# Patient Record
Sex: Male | Born: 1960 | Race: White | Hispanic: No | Marital: Married | Smoking: Current every day smoker
Health system: Southern US, Community
[De-identification: ages and names within clinical notes are randomized; demographics above are authoritative.]

## PROBLEM LIST (undated history)

## (undated) ENCOUNTER — Inpatient Hospital Stay: Admission: EM | Payer: Self-pay | Source: Home / Self Care

---

## 2001-04-17 ENCOUNTER — Emergency Department (HOSPITAL_COMMUNITY): Admission: EM | Admit: 2001-04-17 | Discharge: 2001-04-17 | Payer: Self-pay | Admitting: Emergency Medicine

## 2001-04-17 ENCOUNTER — Encounter: Payer: Self-pay | Admitting: Emergency Medicine

## 2001-05-12 ENCOUNTER — Encounter: Payer: Self-pay | Admitting: Specialist

## 2001-05-12 ENCOUNTER — Ambulatory Visit (HOSPITAL_COMMUNITY): Admission: RE | Admit: 2001-05-12 | Discharge: 2001-05-12 | Payer: Self-pay | Admitting: Specialist

## 2002-06-30 ENCOUNTER — Emergency Department (HOSPITAL_COMMUNITY): Admission: EM | Admit: 2002-06-30 | Discharge: 2002-06-30 | Payer: Self-pay | Admitting: Emergency Medicine

## 2002-06-30 ENCOUNTER — Encounter: Payer: Self-pay | Admitting: Internal Medicine

## 2004-02-02 ENCOUNTER — Emergency Department (HOSPITAL_COMMUNITY): Admission: EM | Admit: 2004-02-02 | Discharge: 2004-02-02 | Payer: Self-pay

## 2005-09-17 ENCOUNTER — Emergency Department (HOSPITAL_COMMUNITY): Admission: EM | Admit: 2005-09-17 | Discharge: 2005-09-17 | Payer: Self-pay | Admitting: Emergency Medicine

## 2006-07-03 ENCOUNTER — Emergency Department (HOSPITAL_COMMUNITY): Admission: EM | Admit: 2006-07-03 | Discharge: 2006-07-03 | Payer: Self-pay | Admitting: Emergency Medicine

## 2006-10-17 ENCOUNTER — Emergency Department (HOSPITAL_COMMUNITY): Admission: EM | Admit: 2006-10-17 | Discharge: 2006-10-17 | Payer: Self-pay | Admitting: Emergency Medicine

## 2007-02-07 ENCOUNTER — Emergency Department (HOSPITAL_COMMUNITY): Admission: EM | Admit: 2007-02-07 | Discharge: 2007-02-07 | Payer: Self-pay | Admitting: Emergency Medicine

## 2008-10-22 ENCOUNTER — Emergency Department (HOSPITAL_COMMUNITY): Admission: EM | Admit: 2008-10-22 | Discharge: 2008-10-22 | Payer: Self-pay | Admitting: Emergency Medicine

## 2010-07-03 ENCOUNTER — Emergency Department (HOSPITAL_COMMUNITY)
Admission: EM | Admit: 2010-07-03 | Discharge: 2010-07-03 | Disposition: A | Discharge status: Home / Self Care | Payer: Self-pay | Attending: Emergency Medicine | Admitting: Emergency Medicine

## 2010-07-03 DIAGNOSIS — L02419 Cutaneous abscess of limb, unspecified: Secondary | ICD-10-CM | POA: Insufficient documentation

## 2012-06-02 ENCOUNTER — Emergency Department (HOSPITAL_COMMUNITY): Payer: No Typology Code available for payment source

## 2012-06-02 ENCOUNTER — Emergency Department (HOSPITAL_COMMUNITY)
Admission: EM | Admit: 2012-06-02 | Discharge: 2012-06-02 | Disposition: A | Discharge status: Home / Self Care | Payer: No Typology Code available for payment source | Attending: Emergency Medicine | Admitting: Emergency Medicine

## 2012-06-02 ENCOUNTER — Encounter (HOSPITAL_COMMUNITY): Payer: Self-pay | Admitting: *Deleted

## 2012-06-02 DIAGNOSIS — S5000XA Contusion of unspecified elbow, initial encounter: Secondary | ICD-10-CM | POA: Insufficient documentation

## 2012-06-02 DIAGNOSIS — S5001XA Contusion of right elbow, initial encounter: Secondary | ICD-10-CM

## 2012-06-02 DIAGNOSIS — S59909A Unspecified injury of unspecified elbow, initial encounter: Secondary | ICD-10-CM | POA: Insufficient documentation

## 2012-06-02 DIAGNOSIS — F17200 Nicotine dependence, unspecified, uncomplicated: Secondary | ICD-10-CM | POA: Insufficient documentation

## 2012-06-02 DIAGNOSIS — Y9389 Activity, other specified: Secondary | ICD-10-CM | POA: Insufficient documentation

## 2012-06-02 DIAGNOSIS — S6990XA Unspecified injury of unspecified wrist, hand and finger(s), initial encounter: Secondary | ICD-10-CM | POA: Insufficient documentation

## 2012-06-02 DIAGNOSIS — S20219A Contusion of unspecified front wall of thorax, initial encounter: Secondary | ICD-10-CM

## 2012-06-02 MED ORDER — CYCLOBENZAPRINE HCL 5 MG PO TABS: 5.0000 mg | ORAL_TABLET | Freq: Three times a day (TID) | ORAL | Status: AC | PRN

## 2012-06-02 MED ORDER — IBUPROFEN 400 MG PO TABS
400.0000 mg | ORAL_TABLET | Freq: Once | ORAL | Status: AC
  Administered 2012-06-02: 400 mg via ORAL
  Filled 2012-06-02: qty 1

## 2012-06-02 MED ORDER — TRAMADOL HCL 50 MG PO TABS
100.0000 mg | ORAL_TABLET | Freq: Once | ORAL | Status: AC
  Administered 2012-06-02: 100 mg via ORAL
  Filled 2012-06-02: qty 2

## 2012-06-02 MED ORDER — TRAMADOL HCL 50 MG PO TABS: 50.0000 mg | ORAL_TABLET | Freq: Four times a day (QID) | ORAL | Status: AC | PRN

## 2012-06-02 MED ORDER — IBUPROFEN 600 MG PO TABS: 600.0000 mg | ORAL_TABLET | Freq: Four times a day (QID) | ORAL | Status: AC | PRN

## 2012-06-02 MED ORDER — CYCLOBENZAPRINE HCL 10 MG PO TABS
10.0000 mg | ORAL_TABLET | Freq: Once | ORAL | Status: AC
  Administered 2012-06-02: 10 mg via ORAL
  Filled 2012-06-02: qty 1
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
## 2012-12-06 ENCOUNTER — Other Ambulatory Visit (HOSPITAL_COMMUNITY): Payer: Self-pay | Admitting: Urology

## 2012-12-06 DIAGNOSIS — N451 Epididymitis: Secondary | ICD-10-CM

## 2012-12-08 ENCOUNTER — Ambulatory Visit (HOSPITAL_COMMUNITY)
Admission: RE | Admit: 2012-12-08 | Discharge: 2012-12-08 | Disposition: A | Discharge status: Home / Self Care | Payer: BC Managed Care – PPO | Source: Ambulatory Visit | Attending: Urology | Admitting: Urology

## 2012-12-08 DIAGNOSIS — N509 Disorder of male genital organs, unspecified: Secondary | ICD-10-CM | POA: Insufficient documentation

## 2012-12-08 DIAGNOSIS — N433 Hydrocele, unspecified: Secondary | ICD-10-CM | POA: Insufficient documentation

## 2012-12-08 DIAGNOSIS — N508 Other specified disorders of male genital organs: Secondary | ICD-10-CM | POA: Insufficient documentation

## 2012-12-08 DIAGNOSIS — N451 Epididymitis: Secondary | ICD-10-CM

## 2013-05-08 ENCOUNTER — Other Ambulatory Visit (HOSPITAL_COMMUNITY): Payer: Self-pay | Admitting: Family Medicine

## 2013-05-08 DIAGNOSIS — M542 Cervicalgia: Secondary | ICD-10-CM

## 2013-05-16 ENCOUNTER — Ambulatory Visit (HOSPITAL_COMMUNITY)
Admission: RE | Admit: 2013-05-16 | Discharge: 2013-05-16 | Disposition: A | Discharge status: Home / Self Care | Payer: BC Managed Care – PPO | Source: Ambulatory Visit | Attending: Family Medicine | Admitting: Family Medicine

## 2013-05-16 ENCOUNTER — Other Ambulatory Visit (HOSPITAL_COMMUNITY): Payer: Self-pay | Admitting: Family Medicine

## 2013-05-16 DIAGNOSIS — M542 Cervicalgia: Secondary | ICD-10-CM

## 2013-05-16 DIAGNOSIS — M25569 Pain in unspecified knee: Secondary | ICD-10-CM | POA: Insufficient documentation

## 2013-05-16 DIAGNOSIS — M4802 Spinal stenosis, cervical region: Secondary | ICD-10-CM | POA: Insufficient documentation

## 2013-05-16 DIAGNOSIS — M1990 Unspecified osteoarthritis, unspecified site: Secondary | ICD-10-CM

## 2013-05-16 DIAGNOSIS — M79609 Pain in unspecified limb: Secondary | ICD-10-CM | POA: Insufficient documentation

## 2013-05-16 DIAGNOSIS — M47812 Spondylosis without myelopathy or radiculopathy, cervical region: Secondary | ICD-10-CM | POA: Insufficient documentation

## 2013-05-16 DIAGNOSIS — M5020 Other cervical disc displacement, unspecified cervical region: Secondary | ICD-10-CM | POA: Insufficient documentation

## 2015-09-26 ENCOUNTER — Other Ambulatory Visit (HOSPITAL_COMMUNITY): Payer: Self-pay | Admitting: Family Medicine

## 2015-09-26 ENCOUNTER — Ambulatory Visit (HOSPITAL_COMMUNITY)
Admission: RE | Admit: 2015-09-26 | Discharge: 2015-09-26 | Disposition: A | Discharge status: Home / Self Care | Payer: 59 | Source: Ambulatory Visit | Attending: Family Medicine | Admitting: Family Medicine

## 2015-09-26 DIAGNOSIS — R52 Pain, unspecified: Secondary | ICD-10-CM

## 2015-09-26 DIAGNOSIS — T148XXA Other injury of unspecified body region, initial encounter: Secondary | ICD-10-CM

## 2015-09-26 DIAGNOSIS — M1990 Unspecified osteoarthritis, unspecified site: Secondary | ICD-10-CM | POA: Diagnosis not present

## 2017-07-25 ENCOUNTER — Other Ambulatory Visit (HOSPITAL_COMMUNITY): Payer: Self-pay | Admitting: Family Medicine

## 2017-07-25 ENCOUNTER — Ambulatory Visit (HOSPITAL_COMMUNITY)
Admission: RE | Admit: 2017-07-25 | Discharge: 2017-07-25 | Disposition: A | Discharge status: Home / Self Care | Payer: 59 | Source: Ambulatory Visit | Attending: Family Medicine | Admitting: Family Medicine

## 2017-07-25 DIAGNOSIS — T1490XA Injury, unspecified, initial encounter: Secondary | ICD-10-CM

## 2017-07-25 DIAGNOSIS — X58XXXA Exposure to other specified factors, initial encounter: Secondary | ICD-10-CM | POA: Diagnosis not present

## 2017-11-10 ENCOUNTER — Other Ambulatory Visit (HOSPITAL_COMMUNITY): Payer: Self-pay | Admitting: Family Medicine

## 2017-11-10 DIAGNOSIS — M542 Cervicalgia: Secondary | ICD-10-CM

## 2017-11-16 ENCOUNTER — Ambulatory Visit (HOSPITAL_COMMUNITY): Payer: 59

## 2017-11-23 ENCOUNTER — Ambulatory Visit (HOSPITAL_COMMUNITY)
Admission: RE | Admit: 2017-11-23 | Discharge: 2017-11-23 | Disposition: A | Discharge status: Home / Self Care | Payer: 59 | Source: Ambulatory Visit | Attending: Family Medicine | Admitting: Family Medicine

## 2017-11-23 DIAGNOSIS — M4802 Spinal stenosis, cervical region: Secondary | ICD-10-CM | POA: Diagnosis not present

## 2017-11-23 DIAGNOSIS — M47812 Spondylosis without myelopathy or radiculopathy, cervical region: Secondary | ICD-10-CM | POA: Diagnosis not present

## 2017-11-23 DIAGNOSIS — M542 Cervicalgia: Secondary | ICD-10-CM | POA: Diagnosis not present

## 2017-11-23 DIAGNOSIS — M79602 Pain in left arm: Secondary | ICD-10-CM | POA: Insufficient documentation

## 2018-09-14 DIAGNOSIS — F17210 Nicotine dependence, cigarettes, uncomplicated: Secondary | ICD-10-CM | POA: Diagnosis not present

## 2018-09-14 DIAGNOSIS — L02213 Cutaneous abscess of chest wall: Secondary | ICD-10-CM | POA: Diagnosis present

## 2018-09-15 ENCOUNTER — Emergency Department (HOSPITAL_COMMUNITY)
Admission: EM | Admit: 2018-09-15 | Discharge: 2018-09-15 | Disposition: A | Discharge status: Home / Self Care | Payer: BLUE CROSS/BLUE SHIELD | Attending: Emergency Medicine | Admitting: Emergency Medicine

## 2018-09-15 ENCOUNTER — Other Ambulatory Visit: Payer: Self-pay

## 2018-09-15 ENCOUNTER — Encounter (HOSPITAL_COMMUNITY): Payer: Self-pay

## 2018-09-15 DIAGNOSIS — L02213 Cutaneous abscess of chest wall: Secondary | ICD-10-CM

## 2018-09-15 MED ORDER — DOXYCYCLINE HYCLATE 100 MG PO TABS
100.0000 mg | ORAL_TABLET | Freq: Once | ORAL | Status: AC
  Administered 2018-09-15: 03:00:00 100 mg via ORAL
  Filled 2018-09-15: qty 1

## 2018-09-15 MED ORDER — LIDOCAINE-EPINEPHRINE (PF) 2 %-1:200000 IJ SOLN
10.0000 mL | Freq: Once | INTRAMUSCULAR | Status: AC
  Administered 2018-09-15: 10 mL
  Filled 2018-09-15: qty 10

## 2018-09-15 MED ORDER — DOXYCYCLINE HYCLATE 100 MG PO CAPS: 100.0000 mg | ORAL_CAPSULE | Freq: Two times a day (BID) | ORAL | 0 refills | Status: AC

## 2018-09-15 MED ORDER — POVIDONE-IODINE 10 % EX SOLN
CUTANEOUS | Status: AC
  Administered 2018-09-15: 2
  Filled 2018-09-15: qty 30

## 2018-09-15 NOTE — ED Triage Notes (Signed)
Pt has an area under his left armpit that he thought was a tick, has tried to remove it, but not sure if the tick is still there, redness and abrasion present

## 2018-09-15 NOTE — ED Notes (Signed)
Dressing applied under patient's left arm. 

## 2018-09-15 NOTE — ED Provider Notes (Addendum)
[facility] Provider Note   CSN: [id number] Arrival date & time: 09/14/18  2359    History   Chief Complaint Chief Complaint  Patient presents with  . Abscess    HPI Evan Hendricks is a 58 y.o. male.  The history is provided by the patient.  He has a lesion in the left axilla which he thinks may be a tick bite.  It is itchy and states he generally does not feel well.  Has been gradually expanding appropriately 2 days since it first occurred.  He can see a dark area in the center and is concerned that a tick is buried in there.  He denies fever chills.  Denies arthralgias or myalgias.  History reviewed. No pertinent past medical history.  There are no active problems to display for this patient.   History reviewed. No pertinent surgical history.      Home Medications    Prior to Admission medications   Medication Sig Start Date End Date Taking? Authorizing Provider  acetaminophen (TYLENOL) 500 MG tablet Take 1,000 mg by mouth every 6 (six) hours as needed. For pain    [provider]  cyclobenzaprine (FLEXERIL) 5 MG tablet Take 1 tablet (5 mg total) by mouth 3 (three) times daily as needed for muscle spasms (and muscle pain). 06/02/12   Devoria AlbeKnapp, Iva, MD  ibuprofen (ADVIL,MOTRIN) 600 MG tablet Take 1 tablet (600 mg total) by mouth every 6 (six) hours as needed for pain. 06/02/12   Devoria AlbeKnapp, Iva, MD  traMADol (ULTRAM) 50 MG tablet Take 1 tablet (50 mg total) by mouth every 6 (six) hours as needed for pain. 06/02/12   Devoria AlbeKnapp, Iva, MD    Family History No family history on file.  Social History Social History   Tobacco Use  . Smoking status: Current Every Day Smoker    Types: Cigarettes  Substance Use Topics  . Alcohol use: No  . Drug use: No     Allergies   Patient has no known allergies.   Review of Systems Review of Systems  All other systems reviewed and are negative.    Physical Exam Updated Vital Signs BP 130/85 (BP Location:  Right Arm)   Pulse 75   Temp 98.2 F (36.8 C) (Oral)   Resp 17   Ht 5\' 8"  (1.727 m)   Wt 61.2 kg   SpO2 98%   BMI 20.53 kg/m   Physical Exam Vitals signs and nursing note reviewed.    58 year old male, resting comfortably and in no acute distress. Vital signs are normal. Oxygen saturation is 98%, which is normal. Head is normocephalic and atraumatic. PERRLA, EOMI. Oropharynx is clear. Neck is nontender and supple without adenopathy or JVD. Back is nontender and there is no CVA tenderness. Lungs are clear without rales, wheezes, or rhonchi. Chest: Erythematous area inferior to the left axilla with dark area in the center.  No tick seen.  Surrounding area of erythema seems to be mainly from excoriation from scratching.  Please see attached photo. Heart has regular rate and rhythm without murmur. Abdomen is soft, flat, nontender without masses or hepatosplenomegaly and peristalsis is normoactive. Extremities have no cyanosis or edema, full range of motion is present. Skin is warm and dry without rash. Neurologic: Mental status is normal, cranial nerves are intact, there are no motor or sensory deficits.  ED Treatments / Results   Procedures .Marland Kitchen.Incision and Drainage Date/Time: 09/15/2018 2:45 AM Performed by: Dione BoozeGlick, Shakeia Krus, MD Authorized  by: Dione Booze, MD   Consent:    Consent obtained:  Verbal   Consent given by:  Patient   Risks discussed:  Bleeding, incomplete drainage and pain   Alternatives discussed:  Alternative treatment Location:    Type:  Abscess   Size:  1 cm   Location:  Trunk   Trunk location:  Chest Pre-procedure details:    Skin preparation:  Antiseptic wash Anesthesia (see MAR for exact dosages):    Anesthesia method:  Local infiltration   Local anesthetic:  Lidocaine 2% WITH epi Procedure type:    Complexity:  Complex Procedure details:    Needle aspiration: no     Incision types:  Cruciate   Incision depth:  Subcutaneous   Scalpel blade:  11    Wound management:  Probed and deloculated   Drainage:  Purulent   Drainage amount:  Scant   Wound treatment:  Wound left open   Packing materials:  None Post-procedure details:    Patient tolerance of procedure:  Tolerated well, no immediate complications   (including critical care time)  Medications Ordered in ED Medications  lidocaine-EPINEPHrine (XYLOCAINE W/EPI) 2 %-1:200000 (PF) injection 10 mL (10 mLs Infiltration Given by Other 09/15/18 0220)  povidone-iodine (BETADINE) 10 % external solution (2 application  Given 09/15/18 0220)  doxycycline (VIBRA-TABS) tablet 100 mg (100 mg Oral Given 09/15/18 0305)     Initial Impression / Assessment and Plan / ED Course  I have reviewed the triage vital signs and the nursing notes.  Localized area of cellulitis with possible abscess with possible underlying arthropod bite.  Will attempt incision and drainage.  Old records are reviewed, and he has no relevant past visits.  Incision and drainage yielded scant serosanguineous and purulent fluid.  The black was removed and was nondescript in appearance and probably was a small clot.  He is discharged with prescription for doxycycline.  Follow-up with PCP.  Final Clinical Impressions(s) / ED Diagnoses   Final diagnoses:  Abscess of chest wall    ED Discharge Orders         Ordered    doxycycline (VIBRAMYCIN) 100 MG capsule  2 times daily     09/15/18 0251           Dione Booze, MD 09/15/18 3361    Dione Booze, MD 09/15/18 [phone]

## 2018-09-15 NOTE — Discharge Instructions (Addendum)
Return if you are having any problems. 

## 2019-08-31 ENCOUNTER — Ambulatory Visit: Payer: BLUE CROSS/BLUE SHIELD | Attending: Internal Medicine

## 2019-08-31 DIAGNOSIS — Z23 Encounter for immunization: Secondary | ICD-10-CM

## 2019-08-31 NOTE — Progress Notes (Signed)
   Covid-19 Vaccination Clinic  Name:  DAISON BRAXTON    MRN: [id number] DOB: 10-29-1960  08/31/2019  Mr. Lonzo was observed post Covid-19 immunization for 15 minutes without incident. He was provided with Vaccine Information Sheet and instruction to access the V-Safe system.   Mr. Parmelee was instructed to call 911 with any severe reactions post vaccine: Marland Kitchen Difficulty breathing  . Swelling of face and throat  . A fast heartbeat  . A bad rash all over body  . Dizziness and weakness   Immunizations Administered    Name Date Dose VIS Date Route   Moderna COVID-19 Vaccine 08/31/2019 12:29 PM 0.5 mL 04/17/2019 Intramuscular   Manufacturer: Moderna   Lot: [id number]   NDC: 11657-903-83

## 2019-10-02 ENCOUNTER — Ambulatory Visit: Payer: BLUE CROSS/BLUE SHIELD | Attending: Internal Medicine

## 2019-10-02 DIAGNOSIS — Z23 Encounter for immunization: Secondary | ICD-10-CM

## 2019-10-02 NOTE — Progress Notes (Signed)
   Covid-19 Vaccination Clinic  Name:  Evan Hendricks    MRN: [id number] DOB: May 31, 1960  10/02/2019  Mr. Kahrs was observed post Covid-19 immunization for 15 minutes without incident. He was provided with Vaccine Information Sheet and instruction to access the V-Safe system.   Mr. Mikita was instructed to call 911 with any severe reactions post vaccine: Marland Kitchen Difficulty breathing  . Swelling of face and throat  . A fast heartbeat  . A bad rash all over body  . Dizziness and weakness   Immunizations Administered    Name Date Dose VIS Date Route   Moderna COVID-19 Vaccine 10/02/2019 12:13 PM 0.5 mL 04/2019 Intramuscular   Manufacturer: Moderna   Lot: [id number]   NDC: 24195-424-81

## 2019-10-28 IMAGING — MR MR CERVICAL SPINE W/O CM
4 of 5 series · 14 of 48 positions shown · non-contrast
Comparison: MRI of the cervical spine May 16, 2013

CLINICAL DATA: Neck pain radiating to LEFT shoulder for greater
than 4 years. Finger numbness. No injury.

EXAM:
MRI CERVICAL SPINE WITHOUT CONTRAST
TECHNIQUE: Multiplanar, multisequence MR imaging of the cervical spine was
performed. No intravenous contrast was administered.

[Series 3: T2 · sagittal · 3.0mm · 0.42mm/px · 5 of 15 slices shown (1 of 2)]
[im 1/15]
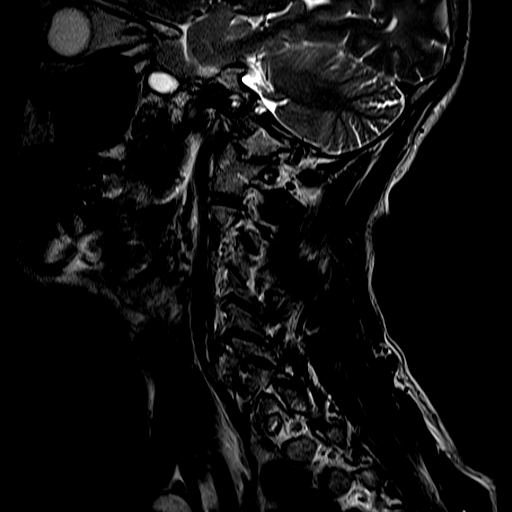
[im 3/15]
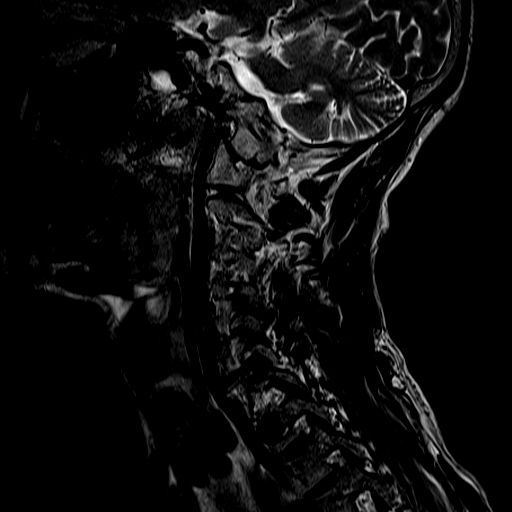
[im 5/15]
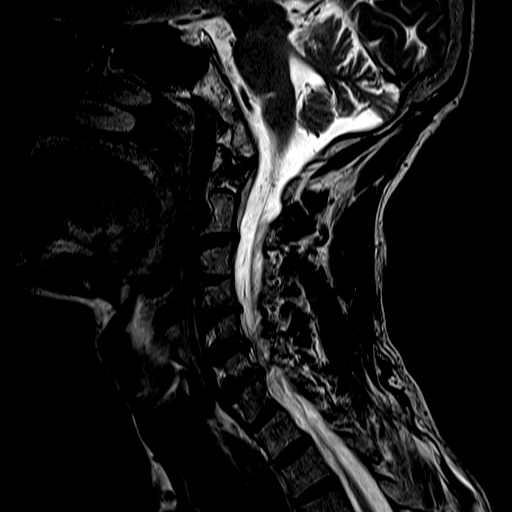
[im 8/15]
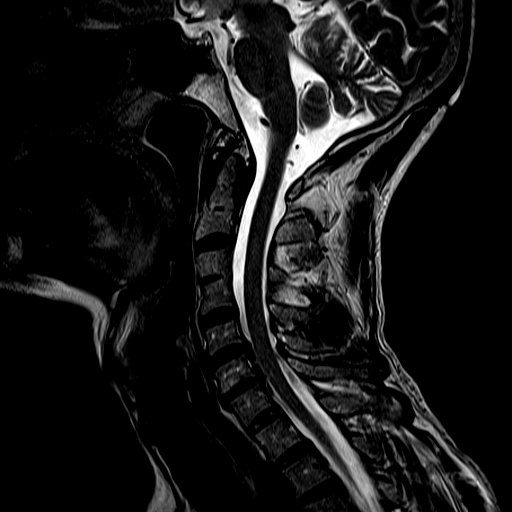
[im 12/15]
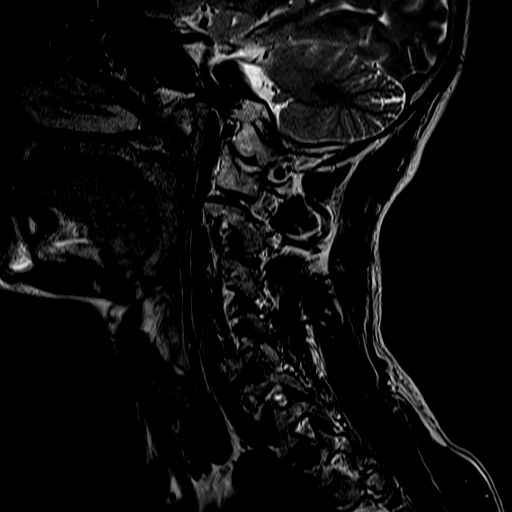

[Series 4: FLAIR · sagittal · 3.0mm · 0.48mm/px · 3 of 15 slices shown]
[im 3/15]
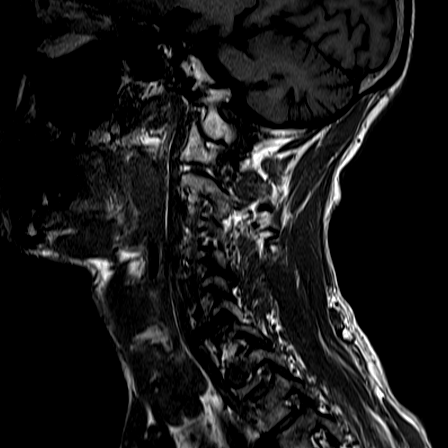
[im 8/15]
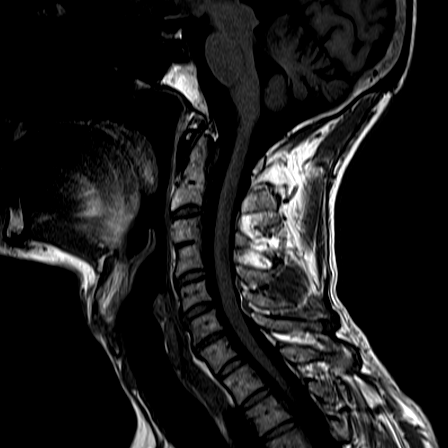
[im 12/15]
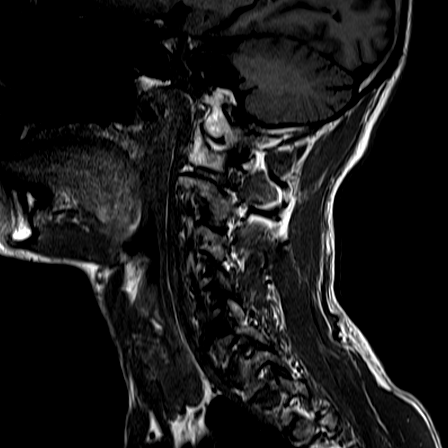

[Series 5: ir sagital · sagittal · 3.0mm · 0.23mm/px · 3 of 15 slices shown]
[im 3/15]
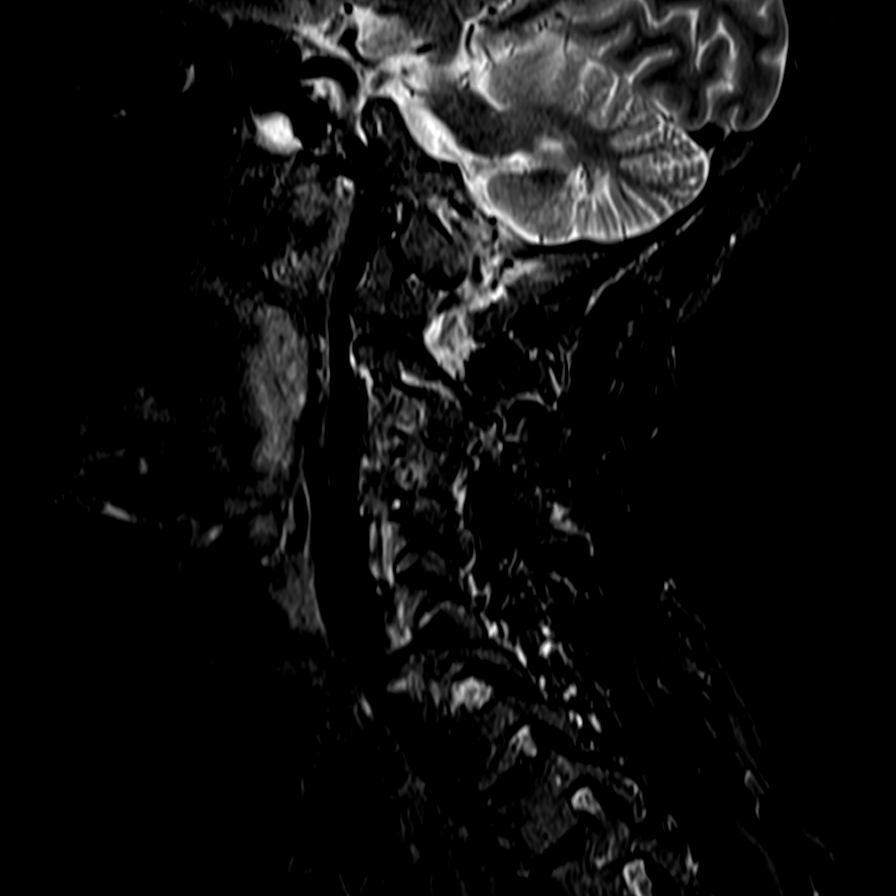
[im 9/15]
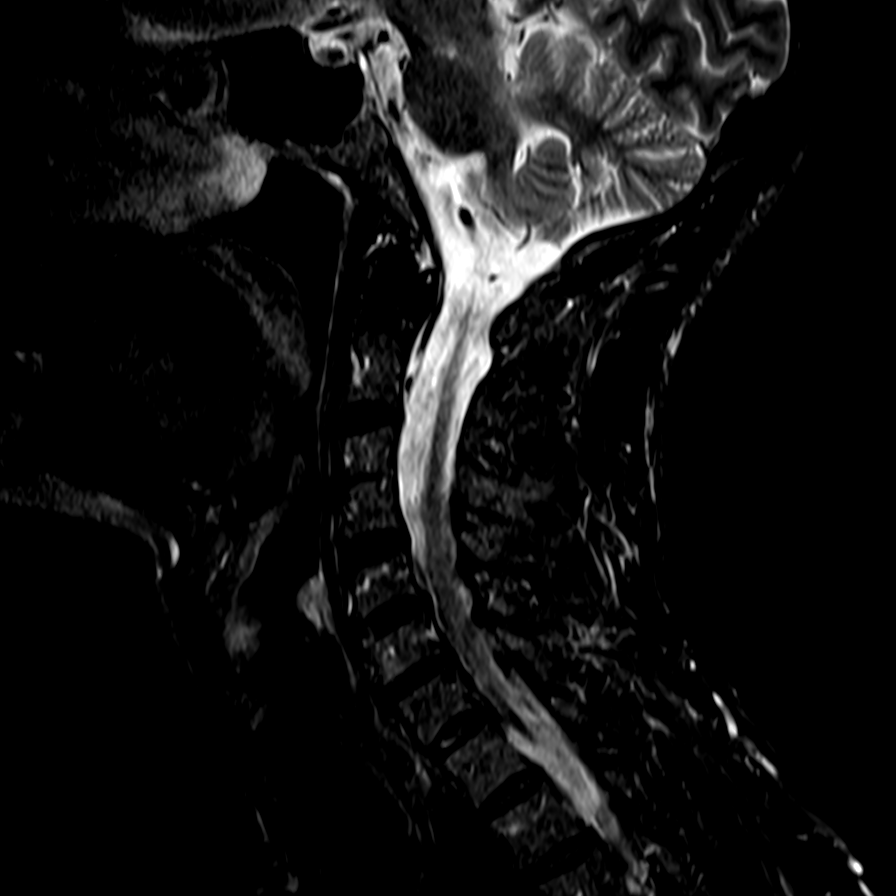
[im 15/15]
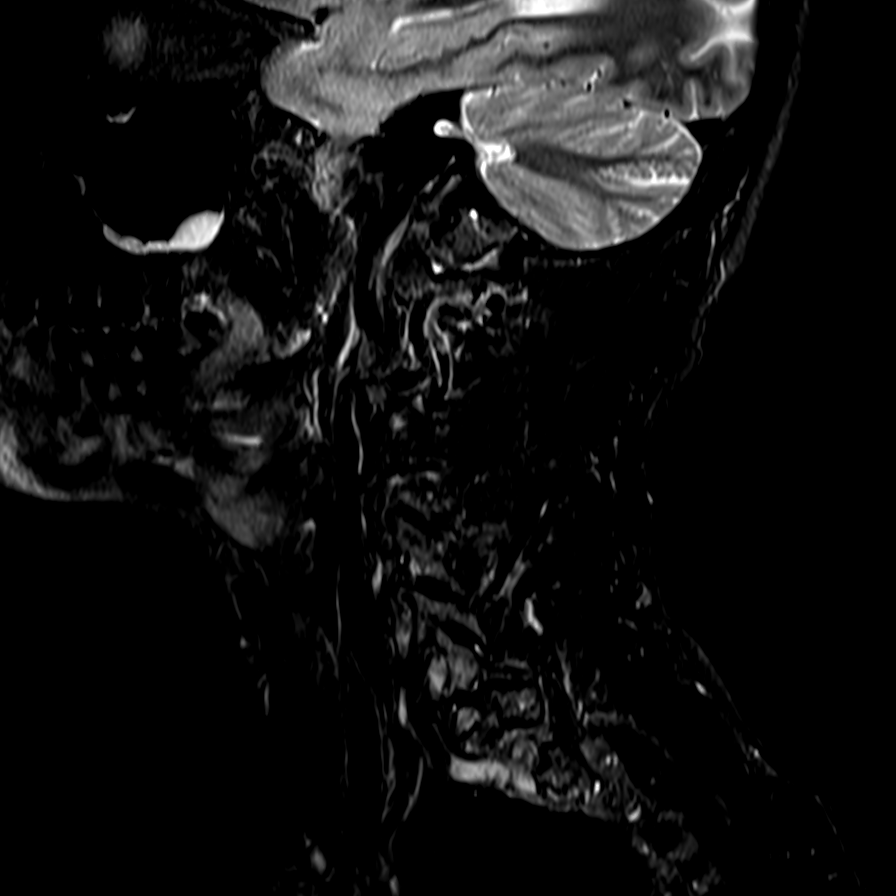

[Series 7: T2 · axial · 3.0mm · 0.22mm/px · z∈[-72,-4]mm · 3 of 33 slices shown (2 of 2)]
[im 5/33]
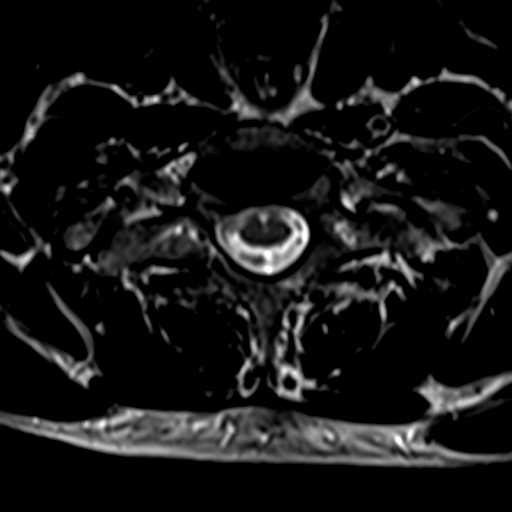
[im 18/33]
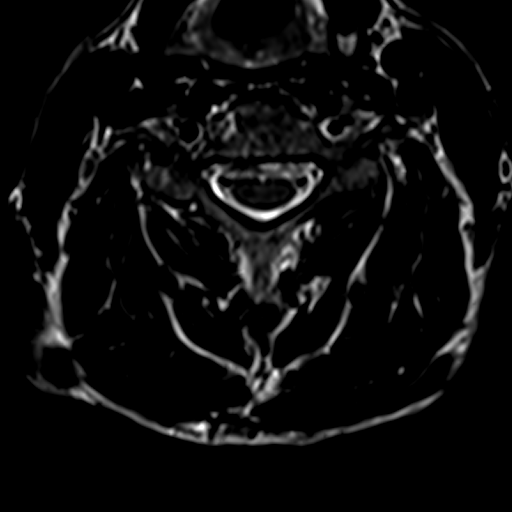
[im 28/33]
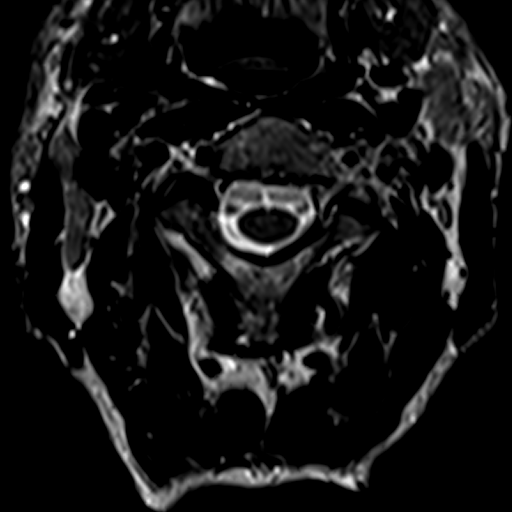

[14 of 48 positions shown; findings below may reference images not displayed]

FINDINGS: ALIGNMENT: Maintained cervical lordosis.  No malalignment.

VERTEBRAE/DISCS: Vertebral bodies are intact. C3-4 segmentation
anomaly, LEFT articular pillar solid bloc. Progressed moderate to
severe C6-7 disc height loss with moderate acute on chronic
discogenic endplate changes. Mild disc desiccation. No suspicious
bone marrow signal.

CORD:Cervical spinal cord is normal morphology and signal
characteristics from the cervicomedullary junction to level of T2-3,
the most caudal well visualized level.

POSTERIOR FOSSA, VERTEBRAL ARTERIES, PARASPINAL TISSUES: No MR
findings of ligamentous injury. Vertebral artery flow voids present.
Included posterior fossa and paraspinal soft tissues are normal.
Mild paranasal sinus mucosal thickening.

DISC LEVELS:

C2-3 and C3-4: No disc bulge, canal stenosis nor neural foraminal
narrowing.

C4-5: Annular bulging, uncovertebral hypertrophy. Moderate LEFT
facet arthropathy. No canal stenosis. Mild-to-moderate LEFT neural
foraminal narrowing.

C5-6: Small broad-based disc bulge, uncovertebral hypertrophy.
Moderate facet arthropathy. Mild canal stenosis. Mild RIGHT,
moderate LEFT neural foraminal narrowing.

C6-7: Small broad-based disc bulge, uncovertebral hypertrophy.
Severe bilateral neural foraminal narrowing.

C7-T1: No disc bulge, canal stenosis nor neural foraminal narrowing.
IMPRESSION: 1. Progressed degenerative change of the cervical spine without
fracture or malalignment.
2. Mild canal stenosis C5-6.
3. Neural foraminal narrowing C4-5 through C6-7: Severe at C6-7.
4. C3-4 segmentation anomaly.

## 2021-10-29 ENCOUNTER — Other Ambulatory Visit (HOSPITAL_COMMUNITY): Payer: Self-pay | Admitting: *Deleted

## 2021-10-29 DIAGNOSIS — I2510 Atherosclerotic heart disease of native coronary artery without angina pectoris: Secondary | ICD-10-CM

## 2021-11-13 ENCOUNTER — Encounter (HOSPITAL_COMMUNITY): Payer: Self-pay

## 2021-12-11 ENCOUNTER — Encounter (HOSPITAL_COMMUNITY): Payer: Self-pay

## 2023-09-08 ENCOUNTER — Other Ambulatory Visit: Payer: Self-pay | Admitting: Medical Genetics

## 2024-03-13 ENCOUNTER — Other Ambulatory Visit: Payer: Self-pay | Admitting: Medical Genetics

## 2024-03-13 DIAGNOSIS — Z006 Encounter for examination for normal comparison and control in clinical research program: Secondary | ICD-10-CM
